# Patient Record
Sex: Female | Born: 1970 | Race: White | Hispanic: No | Marital: Married | Smoking: Never smoker
Health system: Southern US, Community
[De-identification: ages and names within clinical notes are randomized; demographics above are authoritative.]

---

## 2017-04-24 ENCOUNTER — Other Ambulatory Visit: Payer: Self-pay | Admitting: Family Medicine

## 2017-04-24 DIAGNOSIS — Z1231 Encounter for screening mammogram for malignant neoplasm of breast: Secondary | ICD-10-CM

## 2017-05-07 ENCOUNTER — Ambulatory Visit: Payer: Self-pay

## 2017-05-29 ENCOUNTER — Ambulatory Visit
Admission: RE | Admit: 2017-05-29 | Discharge: 2017-05-29 | Disposition: A | Discharge status: Home / Self Care | Source: Ambulatory Visit | Attending: Family Medicine | Admitting: Family Medicine

## 2017-05-29 ENCOUNTER — Other Ambulatory Visit: Payer: Self-pay | Admitting: Family Medicine

## 2017-05-29 DIAGNOSIS — R928 Other abnormal and inconclusive findings on diagnostic imaging of breast: Secondary | ICD-10-CM | POA: Insufficient documentation

## 2017-05-29 DIAGNOSIS — Z1231 Encounter for screening mammogram for malignant neoplasm of breast: Secondary | ICD-10-CM | POA: Insufficient documentation

## 2017-05-31 ENCOUNTER — Other Ambulatory Visit: Payer: Self-pay | Admitting: *Deleted

## 2017-05-31 ENCOUNTER — Inpatient Hospital Stay
Admission: RE | Admit: 2017-05-31 | Discharge: 2017-05-31 | Disposition: A | Discharge status: Home / Self Care | Payer: Self-pay | Source: Ambulatory Visit | Attending: *Deleted | Admitting: *Deleted

## 2017-05-31 DIAGNOSIS — Z9289 Personal history of other medical treatment: Secondary | ICD-10-CM

## 2017-06-05 ENCOUNTER — Other Ambulatory Visit: Payer: Self-pay | Admitting: Family Medicine

## 2017-06-05 DIAGNOSIS — R928 Other abnormal and inconclusive findings on diagnostic imaging of breast: Secondary | ICD-10-CM

## 2017-06-05 DIAGNOSIS — N6489 Other specified disorders of breast: Secondary | ICD-10-CM

## 2017-06-07 ENCOUNTER — Ambulatory Visit
Admission: RE | Admit: 2017-06-07 | Discharge: 2017-06-07 | Disposition: A | Discharge status: Home / Self Care | Source: Ambulatory Visit | Attending: Family Medicine | Admitting: Family Medicine

## 2017-06-07 DIAGNOSIS — N6001 Solitary cyst of right breast: Secondary | ICD-10-CM | POA: Diagnosis not present

## 2017-06-07 DIAGNOSIS — R928 Other abnormal and inconclusive findings on diagnostic imaging of breast: Secondary | ICD-10-CM

## 2017-06-07 DIAGNOSIS — N6489 Other specified disorders of breast: Secondary | ICD-10-CM

## 2018-08-09 IMAGING — MG MM DIGITAL DIAGNOSTIC UNILAT*R* W/ TOMO W/ CAD
6 series · 6 of 14 positions shown · non-contrast
Comparison: Previous exam(s).

CLINICAL DATA: Callback from screening mammogram for possible
asymmetry right breast

EXAM:
2D DIGITAL DIAGNOSTIC RIGHT MAMMOGRAM WITH CAD AND ADJUNCT TOMO
ULTRASOUND RIGHT BREAST

[R MLO synth-2D]
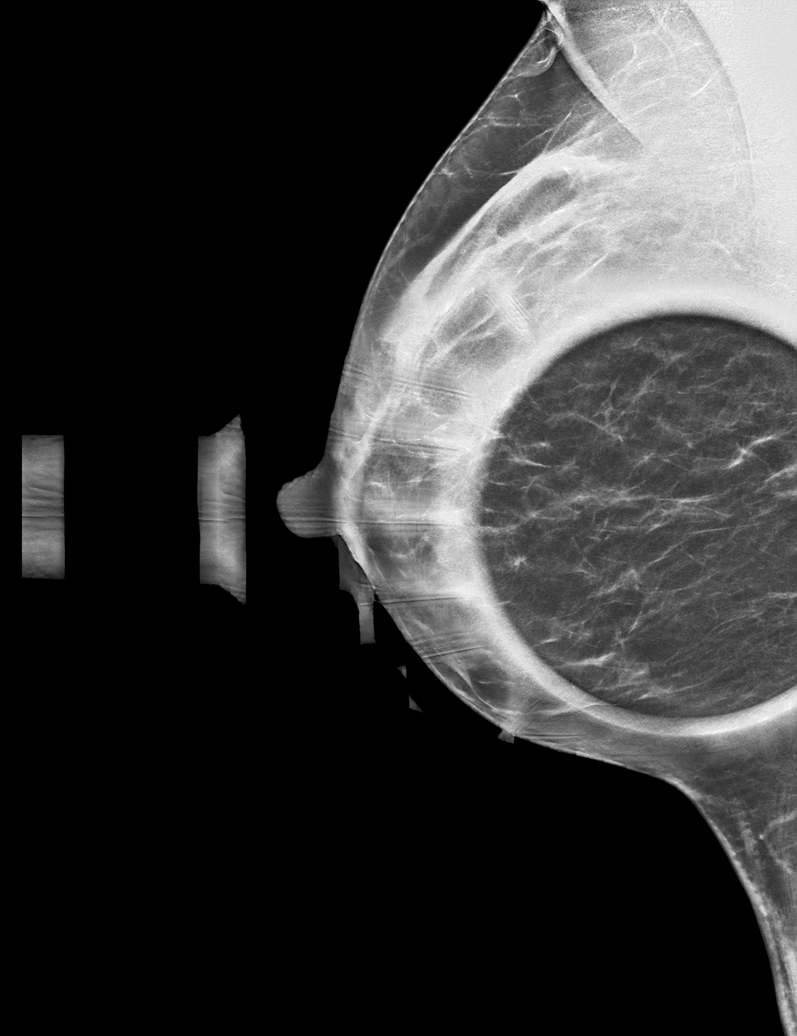

[R ML]
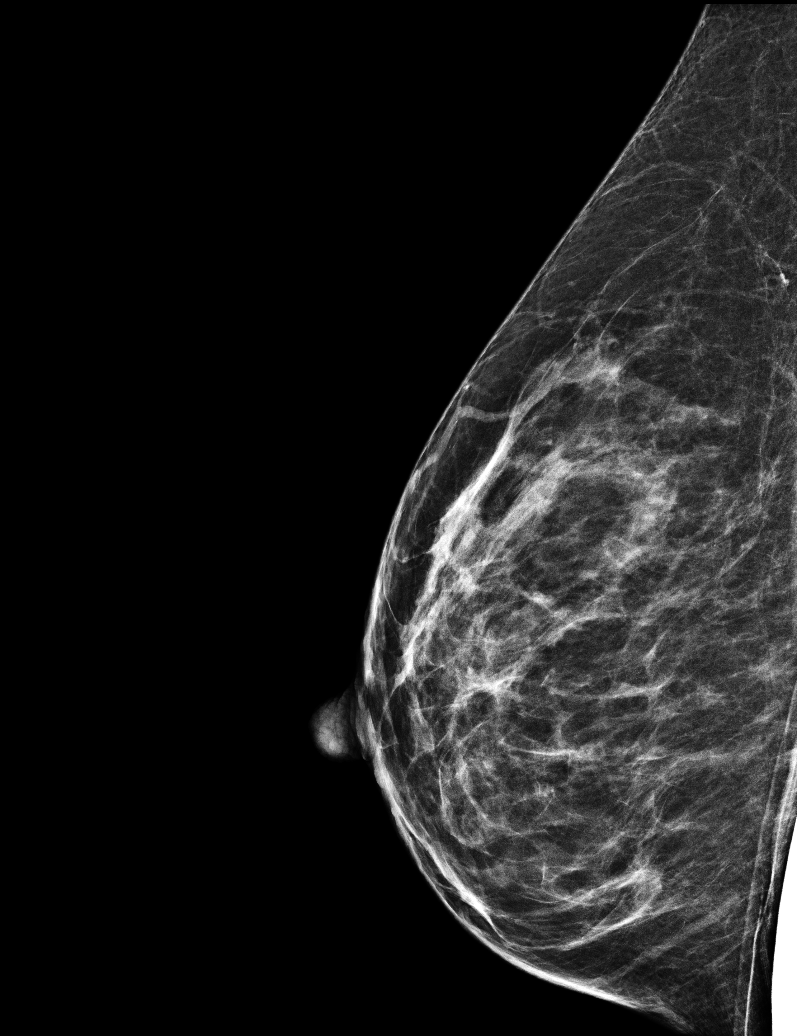

[R MLO]
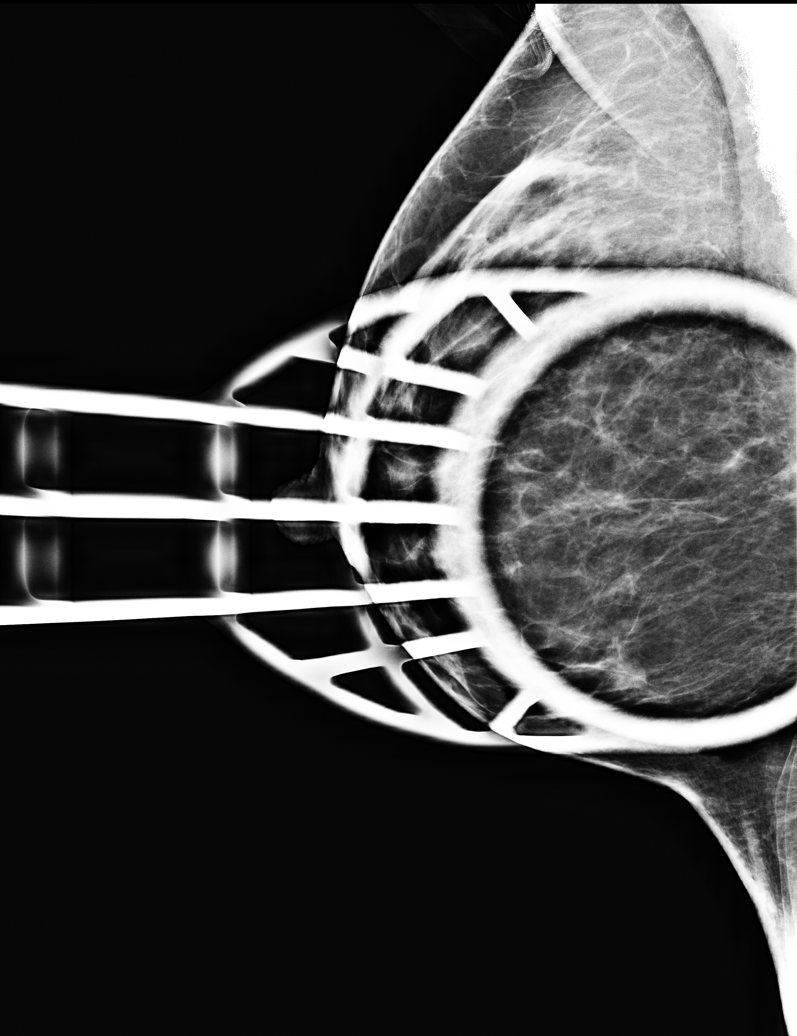

[R ML synth-2D]
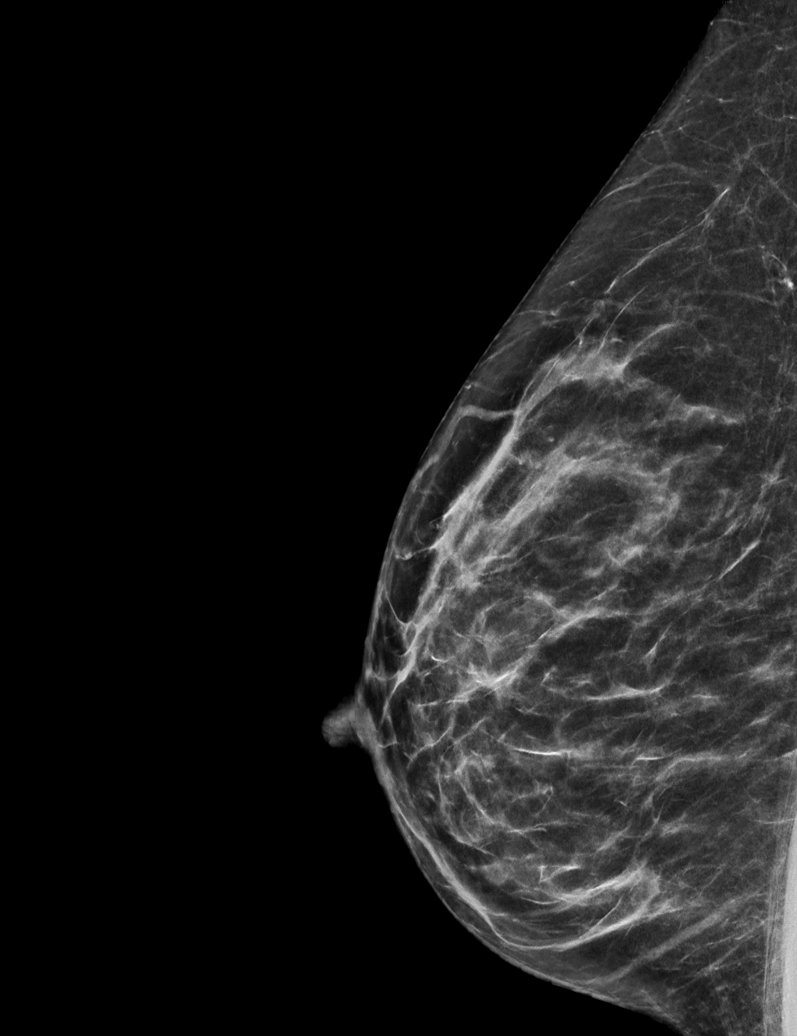

[R ML tomo · tomo slice 30/59.0]
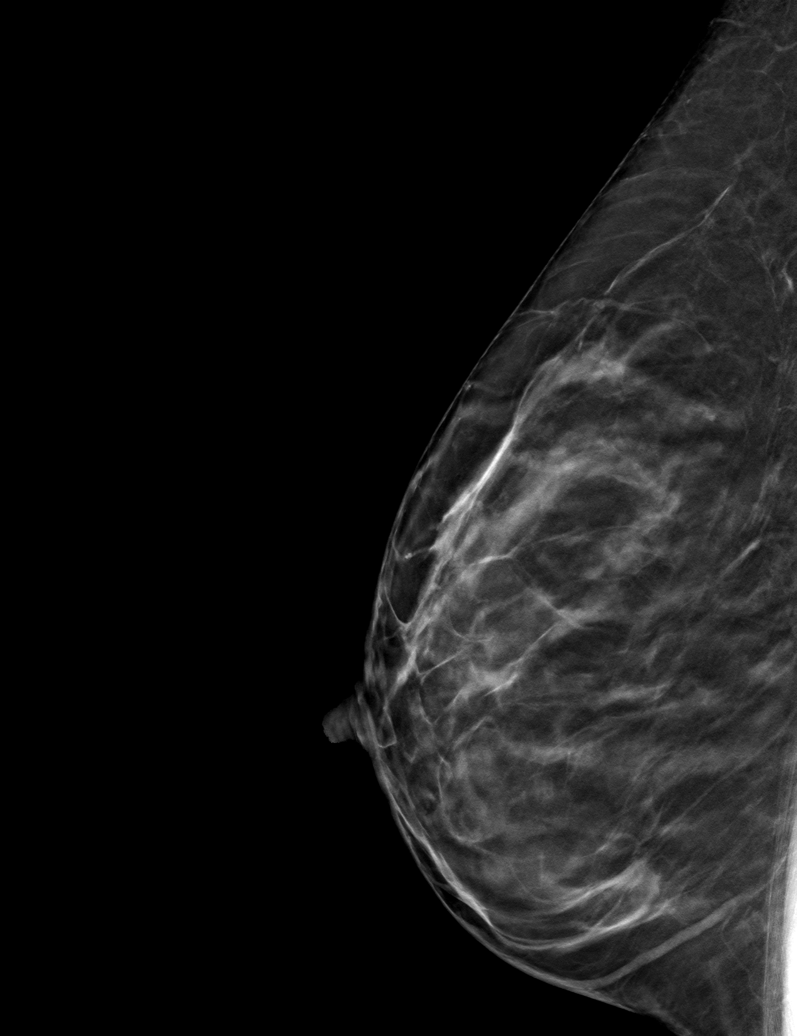

[R MLO tomo · tomo slice 27/53.0]
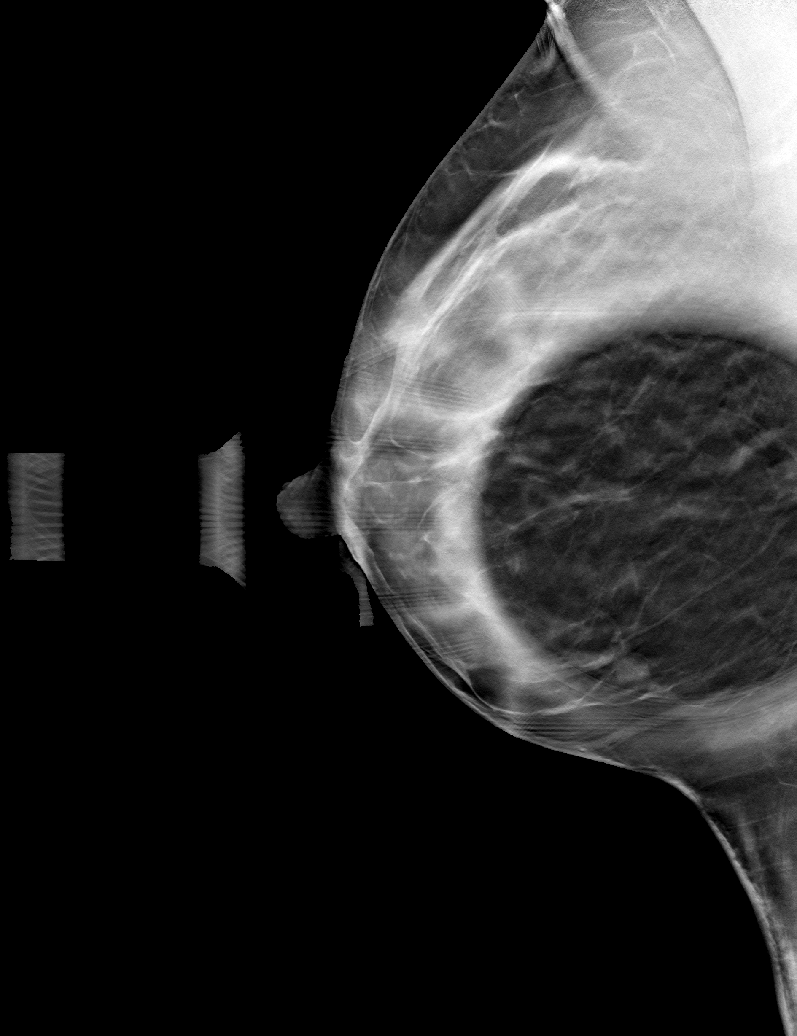

[6 of 14 positions shown; findings below may reference images not displayed]

ACR Breast Density Category b: There are scattered areas of
fibroglandular density.
FINDINGS: Lateral view of right breast, spot compression right MLO view are
submitted. The previously questioned asymmetry is much less
prominent on additional views and appears to represent superimposed
breast parenchyma on the lateral view.

Mammographic images were processed with CAD.

Targeted ultrasound is performed, showing a 4 mm simple cyst at the
right breast 6 o'clock 1 cm from nipple. This may not correlate to
the mammographic finding. No other focal discrete cystic or solid
lesion is identified in the lower right breast.
IMPRESSION: Probable benign findings.

RECOMMENDATION:
Six-month follow-up mammogram right breast.

I have discussed the findings and recommendations with the patient.
Results were also provided in writing at the conclusion of the
visit. If applicable, a reminder letter will be sent to the patient
regarding the next appointment.

BI-RADS CATEGORY  3: Probably benign.

## 2018-08-28 ENCOUNTER — Ambulatory Visit
Admission: EM | Admit: 2018-08-28 | Discharge: 2018-08-28 | Disposition: A | Discharge status: Home / Self Care | Attending: Family Medicine | Admitting: Family Medicine

## 2018-08-28 ENCOUNTER — Other Ambulatory Visit: Payer: Self-pay

## 2018-08-28 ENCOUNTER — Encounter: Payer: Self-pay | Admitting: Emergency Medicine

## 2018-08-28 DIAGNOSIS — B358 Other dermatophytoses: Secondary | ICD-10-CM | POA: Diagnosis not present

## 2018-08-28 MED ORDER — FLUCONAZOLE 150 MG PO TABS: 150.0000 mg | ORAL_TABLET | ORAL | 0 refills | Status: AC

## 2018-08-28 NOTE — ED Provider Notes (Signed)
[facility]    CSN: [id number] Arrival date & time: 08/28/18  5170  History   Chief Complaint Chief Complaint  Patient presents with  . Rash   HPI  48 year old female presents with rash.  Patient has had a rash for the past 1.5 weeks.  Located in left temporal region near the eye.  It is red and raised.  She has used topical cortisone 10 with worsening.  She states it is mildly itchy.  Slightly painful.  Seems to be worsening.  No known relieving factors.  No other associated symptoms.  No other complaints.  History reviewed as below.  PMH: Depression  OB History   No obstetric history on file.    Home Medications    Prior to Admission medications   Medication Sig Start Date End Date Taking? Authorizing Provider  fluconazole (DIFLUCAN) 150 MG tablet Take 1 tablet (150 mg total) by mouth once a week. 08/28/18   Tommie Sams, DO   Family History Family History  Adopted: Yes    Social History Social History   Tobacco Use  . Smoking status: Never Smoker  . Smokeless tobacco: Never Used  Substance Use Topics  . Alcohol use: Not Currently  . Drug use: Never    Allergies   Patient has no known allergies.   Review of Systems Review of Systems  Constitutional: Negative.   Skin: Positive for rash.   Physical Exam Triage Vital Signs ED Triage Vitals  Enc Vitals Group     BP 08/28/18 1014 126/70     Pulse Rate 08/28/18 1014 66     Resp 08/28/18 1014 18     Temp 08/28/18 1014 98.5 F (36.9 C)     Temp Source 08/28/18 1014 Oral     SpO2 08/28/18 1014 100 %     Weight 08/28/18 1012 190 lb (86.2 kg)     Height 08/28/18 1012 5\' 6"  (1.676 m)     Head Circumference --      Peak Flow --      Pain Score 08/28/18 1011 1     Pain Loc --      Pain Edu? --      Excl. in GC? --    Updated Vital Signs BP 126/70 (BP Location: Right Arm)   Pulse 66   Temp 98.5 F (36.9 C) (Oral)   Resp 18   Ht 5\' 6"  (1.676 m)   Wt 86.2 kg   LMP 08/28/2018   SpO2  100%   BMI 30.67 kg/m   Visual Acuity Right Eye Distance:   Left Eye Distance:   Bilateral Distance:    Right Eye Near:   Left Eye Near:    Bilateral Near:     Physical Exam Vitals signs and nursing note reviewed.  Constitutional:      General: She is not in acute distress.    Appearance: She is not ill-appearing.  HENT:     Head: Normocephalic and atraumatic.     Nose: Nose normal.  Eyes:     General:        Right eye: No discharge.        Left eye: No discharge.     Conjunctiva/sclera: Conjunctivae normal.  Pulmonary:     Effort: Pulmonary effort is normal. No respiratory distress.  Neurological:     Mental Status: She is alert.  Psychiatric:        Mood and Affect: Mood normal.  Behavior: Behavior normal.    UC Treatments / Results  Labs (all labs ordered are listed, but only abnormal results are displayed) Labs Reviewed - No data to display  EKG None  Radiology No results found.  Procedures Procedures (including critical care time)  Medications Ordered in UC Medications - No data to display  Initial Impression / Assessment and Plan / UC Course  I have reviewed the triage vital signs and the nursing notes.  Pertinent labs & imaging results that were available during my care of the patient were reviewed by me and considered in my medical decision making (see chart for details).    48 year old female presents with rash.  Appears to be tinea.  Placed on Diflucan given its location near the eye.  Final Clinical Impressions(s) / UC Diagnoses   Final diagnoses:  Tinea faciale     Discharge Instructions     Medication as prescribed.  Take care  Dr. Adriana Simasook    ED Prescriptions    Medication Sig Dispense Auth. Provider   fluconazole (DIFLUCAN) 150 MG tablet Take 1 tablet (150 mg total) by mouth once a week. 4 tablet Tommie Samsook, Yuliet Needs G, DO     Controlled Substance Prescriptions [place] Controlled Substance Registry consulted? Not Applicable     Tommie SamsCook, Roxy Filler G, DO 08/28/18 1312

## 2018-08-28 NOTE — Discharge Instructions (Signed)
Medication as prescribed.  Take care  Dr. Keashia Haskins  

## 2018-08-28 NOTE — ED Triage Notes (Signed)
Patient states she started with a rash about the size of a pencil eraser on the left side of her eye. She states she put an eczema cream and cortisone 10 cream on the rash which she states has made it worse. The area is now the size of a half dollar.

## 2019-01-13 ENCOUNTER — Other Ambulatory Visit: Payer: Self-pay | Admitting: Gerontology

## 2019-01-13 DIAGNOSIS — Z1231 Encounter for screening mammogram for malignant neoplasm of breast: Secondary | ICD-10-CM

## 2019-06-01 ENCOUNTER — Other Ambulatory Visit: Payer: Self-pay | Admitting: Gerontology

## 2019-06-01 DIAGNOSIS — Z1231 Encounter for screening mammogram for malignant neoplasm of breast: Secondary | ICD-10-CM

## 2019-06-24 ENCOUNTER — Ambulatory Visit
Admission: RE | Admit: 2019-06-24 | Discharge: 2019-06-24 | Disposition: A | Discharge status: Home / Self Care | Source: Ambulatory Visit | Attending: Gerontology | Admitting: Gerontology

## 2019-06-24 DIAGNOSIS — Z1231 Encounter for screening mammogram for malignant neoplasm of breast: Secondary | ICD-10-CM

## 2019-10-20 ENCOUNTER — Other Ambulatory Visit: Payer: Self-pay | Admitting: Gerontology

## 2019-10-20 DIAGNOSIS — R1032 Left lower quadrant pain: Secondary | ICD-10-CM

## 2020-05-02 ENCOUNTER — Other Ambulatory Visit: Payer: Self-pay | Admitting: Gerontology

## 2020-05-02 DIAGNOSIS — Z1231 Encounter for screening mammogram for malignant neoplasm of breast: Secondary | ICD-10-CM

## 2022-09-12 ENCOUNTER — Other Ambulatory Visit: Payer: Self-pay | Admitting: Gerontology

## 2022-09-12 DIAGNOSIS — Z1231 Encounter for screening mammogram for malignant neoplasm of breast: Secondary | ICD-10-CM

## 2022-12-28 ENCOUNTER — Ambulatory Visit
Admission: RE | Admit: 2022-12-28 | Discharge: 2022-12-28 | Disposition: A | Discharge status: Home / Self Care | Source: Ambulatory Visit | Attending: Gerontology | Admitting: Gerontology

## 2022-12-28 DIAGNOSIS — Z1231 Encounter for screening mammogram for malignant neoplasm of breast: Secondary | ICD-10-CM | POA: Insufficient documentation

## 2023-01-02 ENCOUNTER — Other Ambulatory Visit: Payer: Self-pay | Admitting: Gerontology

## 2023-01-02 DIAGNOSIS — N6489 Other specified disorders of breast: Secondary | ICD-10-CM

## 2023-01-02 DIAGNOSIS — R928 Other abnormal and inconclusive findings on diagnostic imaging of breast: Secondary | ICD-10-CM

## 2023-01-07 ENCOUNTER — Ambulatory Visit
Admission: RE | Admit: 2023-01-07 | Discharge: 2023-01-07 | Disposition: A | Discharge status: Home / Self Care | Source: Ambulatory Visit | Attending: Gerontology | Admitting: Gerontology

## 2023-01-07 DIAGNOSIS — N6489 Other specified disorders of breast: Secondary | ICD-10-CM | POA: Insufficient documentation

## 2023-01-07 DIAGNOSIS — R928 Other abnormal and inconclusive findings on diagnostic imaging of breast: Secondary | ICD-10-CM

## 2023-01-29 ENCOUNTER — Encounter

## 2023-06-25 ENCOUNTER — Other Ambulatory Visit: Payer: Self-pay | Admitting: Gerontology

## 2023-06-25 DIAGNOSIS — N6489 Other specified disorders of breast: Secondary | ICD-10-CM

## 2023-09-05 ENCOUNTER — Ambulatory Visit
Admission: RE | Admit: 2023-09-05 | Discharge: 2023-09-05 | Disposition: A | Discharge status: Home / Self Care | Source: Ambulatory Visit | Attending: Gerontology | Admitting: Gerontology

## 2023-09-05 DIAGNOSIS — N6489 Other specified disorders of breast: Secondary | ICD-10-CM

## 2023-09-10 ENCOUNTER — Other Ambulatory Visit: Payer: Self-pay | Admitting: Family Medicine

## 2023-09-10 DIAGNOSIS — N6489 Other specified disorders of breast: Secondary | ICD-10-CM

## 2023-09-10 DIAGNOSIS — R928 Other abnormal and inconclusive findings on diagnostic imaging of breast: Secondary | ICD-10-CM

## 2023-09-16 ENCOUNTER — Ambulatory Visit
Admission: RE | Admit: 2023-09-16 | Discharge: 2023-09-16 | Disposition: A | Discharge status: Home / Self Care | Source: Ambulatory Visit | Attending: Family Medicine | Admitting: Family Medicine

## 2023-09-16 DIAGNOSIS — N62 Hypertrophy of breast: Secondary | ICD-10-CM | POA: Diagnosis present

## 2023-09-16 DIAGNOSIS — R928 Other abnormal and inconclusive findings on diagnostic imaging of breast: Secondary | ICD-10-CM

## 2023-09-16 DIAGNOSIS — N6489 Other specified disorders of breast: Secondary | ICD-10-CM

## 2023-09-16 HISTORY — PX: BREAST BIOPSY: SHX20

## 2023-09-16 MED ORDER — LIDOCAINE-EPINEPHRINE 1 %-1:100000 IJ SOLN
20.0000 mL | Freq: Once | INTRAMUSCULAR | Status: AC
  Administered 2023-09-16: 20 mL
  Filled 2023-09-16: qty 20

## 2023-09-16 MED ORDER — LIDOCAINE 1 % OPTIME INJ - NO CHARGE
5.0000 mL | Freq: Once | INTRAMUSCULAR | Status: AC
  Administered 2023-09-16: 5 mL
  Filled 2023-09-16: qty 6

## 2023-09-17 LAB — SURGICAL PATHOLOGY
